# Patient Record
Sex: Female | Born: 2000
Health system: Southern US, Community
[De-identification: ages and names within clinical notes are randomized; demographics above are authoritative.]

## PROBLEM LIST (undated history)

## (undated) DIAGNOSIS — T7840XA Allergy, unspecified, initial encounter: Secondary | ICD-10-CM

## (undated) HISTORY — DX: Allergy, unspecified, initial encounter: T78.40XA

---
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
## 2018-02-04 ENCOUNTER — Encounter: Payer: Self-pay | Admitting: Obstetrics & Gynecology

## 2018-02-04 ENCOUNTER — Ambulatory Visit (INDEPENDENT_AMBULATORY_CARE_PROVIDER_SITE_OTHER): Payer: 59 | Admitting: Obstetrics & Gynecology

## 2018-02-04 VITALS — BP 125/81 | HR 69 | Wt 175.7 lb

## 2018-02-04 DIAGNOSIS — N83291 Other ovarian cyst, right side: Secondary | ICD-10-CM | POA: Diagnosis not present

## 2018-02-04 NOTE — Progress Notes (Signed)
History:  17 y.o. G0  Pt was in the ED in July had pain . Was noted to have a cyst on her voary.  Has not had any pain since that day. Sexually active. No new partners.  Pt has a 5 year IUD. Placed Dec. 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    The following portions of the patient's history were reviewed and updated as appropriate: allergies, current medications, past family history, past medical history, past social history, past surgical history and problem list.  Review of Systems:  Pertinent items are noted in HPI.    Objective:  Physical Exam Blood pressure 125/81, pulse 69, weight 175 lb 11.2 oz (79.7 kg), last menstrual period 02/02/2018.  CONSTITUTIONAL: Well-developed, well-nourished female in no acute distress.  HENT:  Normocephalic, atraumatic EYES: Conjunctivae and EOM are normal. No scleral icterus.  NECK: Normal range of motion SKIN: Skin is warm and dry. No rash noted. Not diaphoretic.No pallor. NEUROLGIC: Alert and oriented to person, place, and time. Normal coordination.  Lungs: CTA CV: RRR Abd: Soft, nontender and nondistended Pelvic: Normal appearing external genitalia; Normal discharge.  Small uterus, no other palpable masses, no uterine or adnexal tenderness; IUD strings palpable. NO CMT  Labs and Imaging 7/8/20149 CLINICAL DATA:  Lower abdominal pain began approximately 45 minutes prior to presentation. Evaluate for possible tubo-ovarian abscess or torsion. The patient has an IUD. Onset of last normal menstrual period was December 03, 2017.  EXAM: TRANSABDOMINAL AND TRANSVAGINAL ULTRASOUND OF PELVIS  DOPPLER ULTRASOUND OF OVARIES  TECHNIQUE: Both transabdominal and transvaginal ultrasound examinations of the pelvis were performed. Transabdominal technique was performed for global imaging of the pelvis including uterus, ovaries, adnexal regions, and pelvic cul-de-sac.  It was necessary to proceed with endovaginal exam following  the transabdominal exam to visualize the uterus and adnexal structures. Color and duplex Doppler ultrasound was utilized to evaluate blood flow to the ovaries.  COMPARISON:  None in PACs  FINDINGS: Uterus  Measurements: 6.3 x 3.4 x 3.4 cm. No fibroids or other mass visualized.  Endometrium  Thickness: 9 mm. The echogenic IUD is visible. No abnormal endometrial fluid collections are observed.  Right ovary  Measurements: 5.7 x 4.3 x 3.9 cm. There is a complex appearing cystic structure measuring 3.3 x 2.8 x 2.3 cm  Left ovary  Measurements: 3.7 x 1.8 x 1.4 cm. Normal appearance/no adnexal mass.  Pulsed Doppler evaluation of both ovaries demonstrates normal low-resistance arterial and venous waveforms.  Other findings  There is free fluid in the pelvis containing a small amount of debris.  IMPRESSION: No evidence of ovarian torsion. Complex cystic right adnexal mass either within or arising from the ovary which is not hypervascular may reflect a hemorrhagic cyst or tubo-ovarian abscess. Ectopic pregnancy or neoplasm is felt less likely.  Normal appearing left ovary. Small amount of free fluid containing debris within the cul-de-sac.  Normal appearance of the uterus with IUD.  Gynecological evaluation is recommended.   Assessment & Plan:  Right ov cyst- small; asymptomatic. Marland Kitchen.   Suspect resolution   Repeat US in 3 months  Discussed school and choice of major.   F/u in 1 year for annual   Destiny Trickey L. Harraway-Smith, M.D., Evern CoreFACOG

## 2018-02-05 ENCOUNTER — Encounter: Payer: Self-pay | Admitting: Obstetrics & Gynecology

## 2018-02-25 DIAGNOSIS — H5213 Myopia, bilateral: Secondary | ICD-10-CM | POA: Diagnosis not present

## 2018-02-25 DIAGNOSIS — H52223 Regular astigmatism, bilateral: Secondary | ICD-10-CM | POA: Diagnosis not present

## 2020-02-10 IMAGING — US US ART/VEN ABD/PELV/SCROTUM DOPPLER LTD
1 series · 13 of 25 positions shown · non-contrast
Comparison: None in PACs

CLINICAL DATA: Lower abdominal pain began approximately 45 minutes
prior to presentation. Evaluate for possible tubo-ovarian abscess or
torsion. The patient has an IUD. Onset of last normal menstrual
period was December 03, 2017.

EXAM:
TRANSABDOMINAL AND TRANSVAGINAL ULTRASOUND OF PELVIS
DOPPLER ULTRASOUND OF OVARIES
TECHNIQUE: Both transabdominal and transvaginal ultrasound examinations of the
pelvis were performed. Transabdominal technique was performed for
global imaging of the pelvis including uterus, ovaries, adnexal
regions, and pelvic cul-de-sac.
It was necessary to proceed with endovaginal exam following the
transabdominal exam to visualize the uterus and adnexal structures..
Color and duplex Doppler ultrasound was utilized to evaluate blood
flow to the ovaries.

[Series 1: us art/ven abd/pelv/scrotum doppler ltd · 0.24mm/px · 107 acquisitions, 13 frames shown]
[im 1/107]
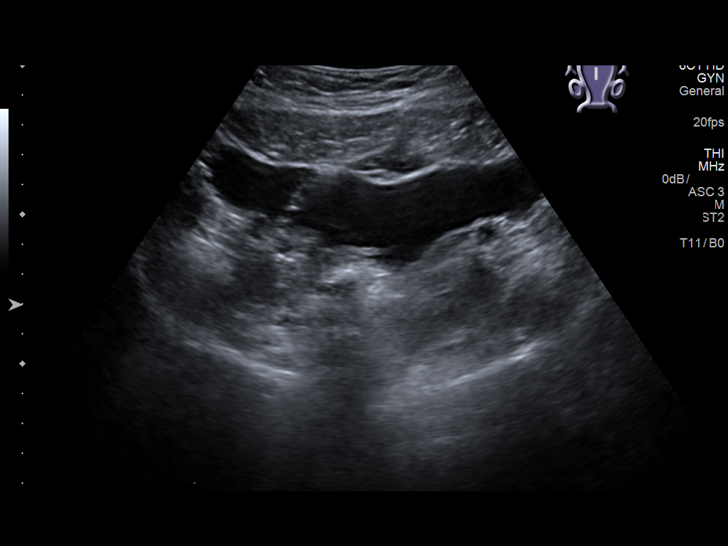
[im 9/107]
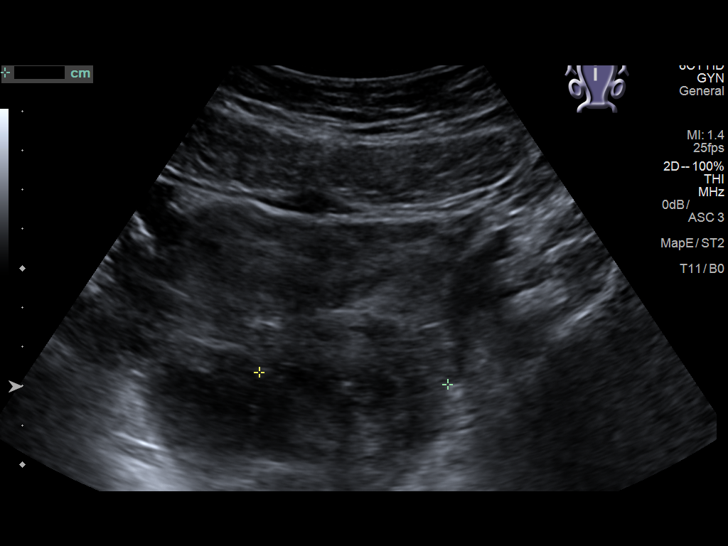
[im 18/107]
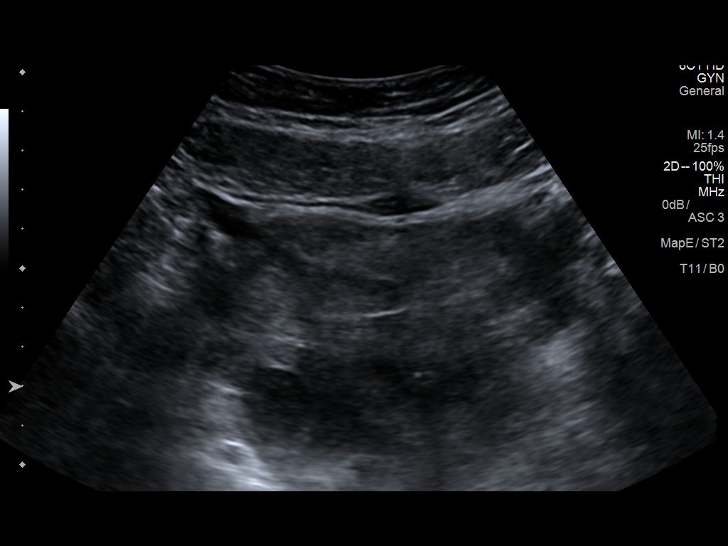
[im 27/107]
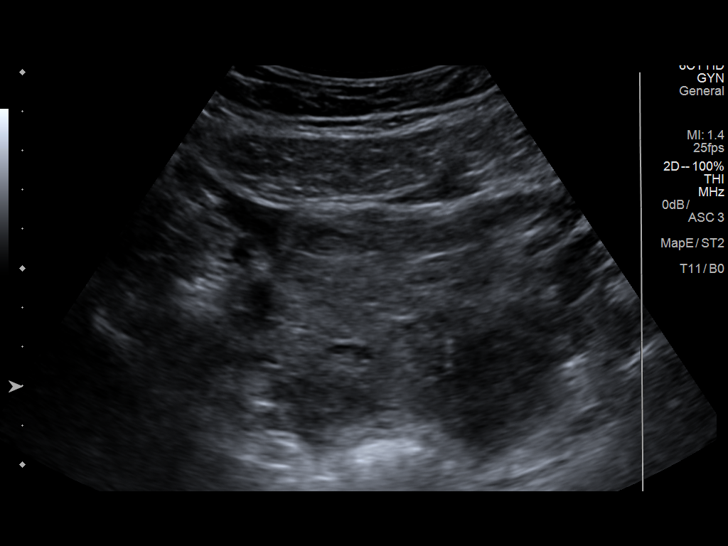
[im 36/107]
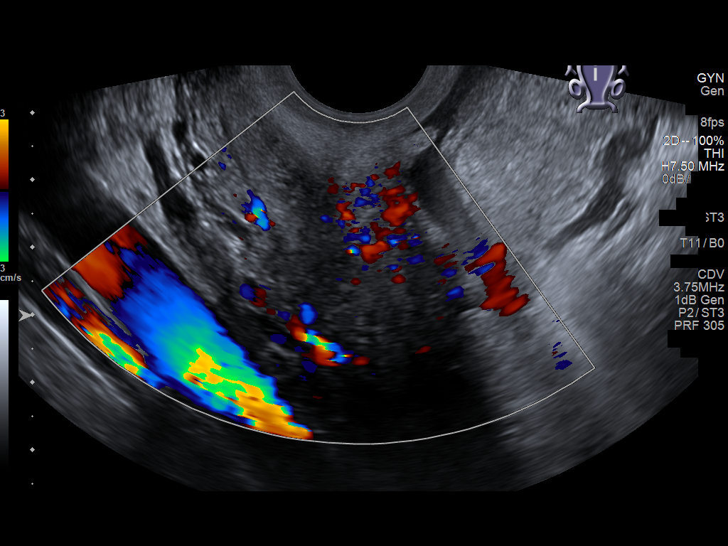
[im 45/107]
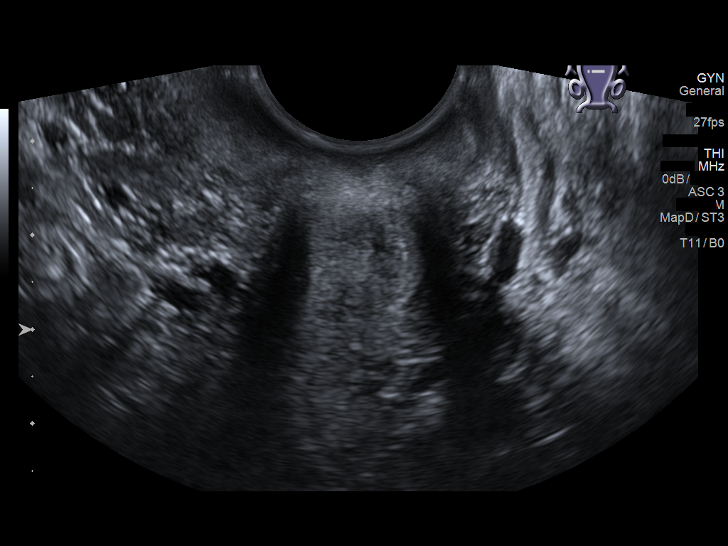
[im 54/107]
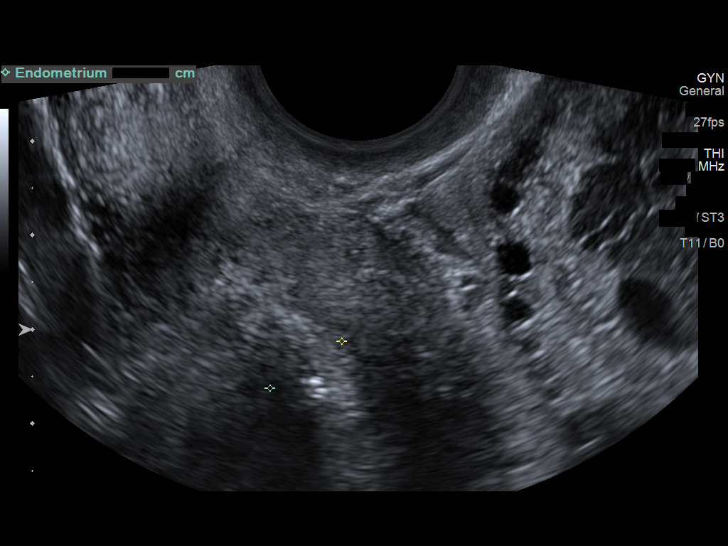
[im 62/107]
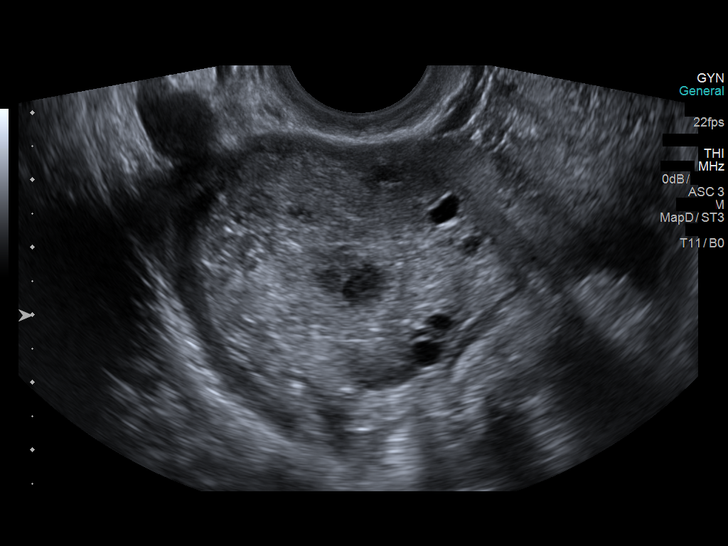
[im 71/107]
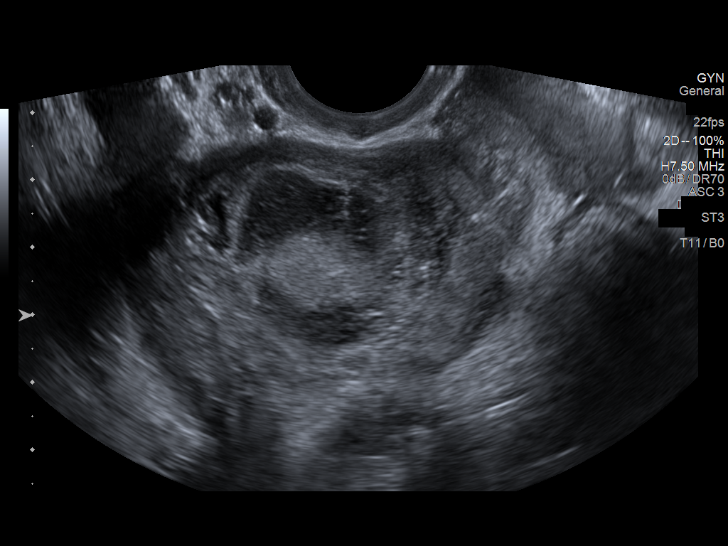
[im 80/107]
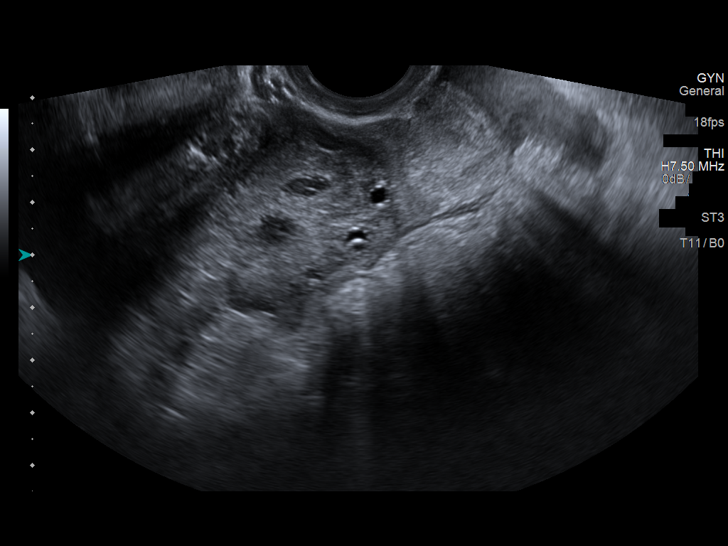
[im 89/107]
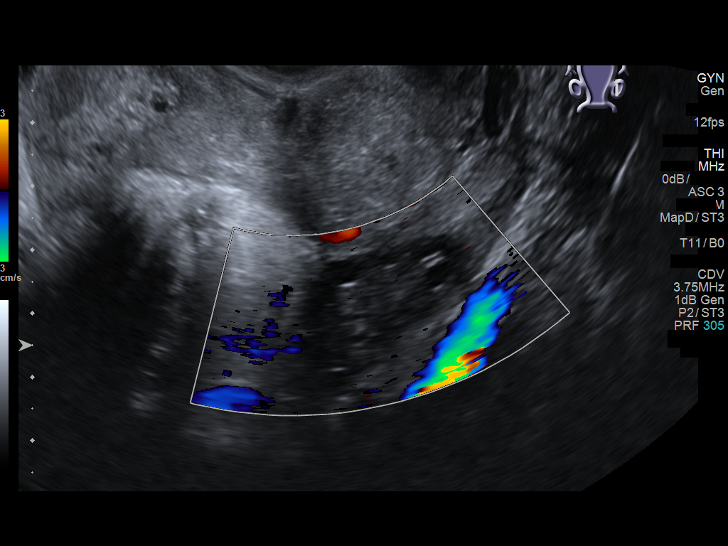
[im 98/107]
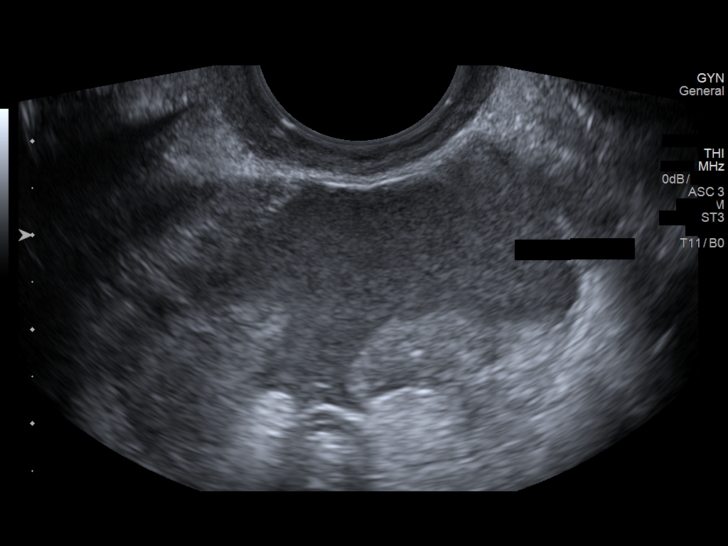
[im 107/107]
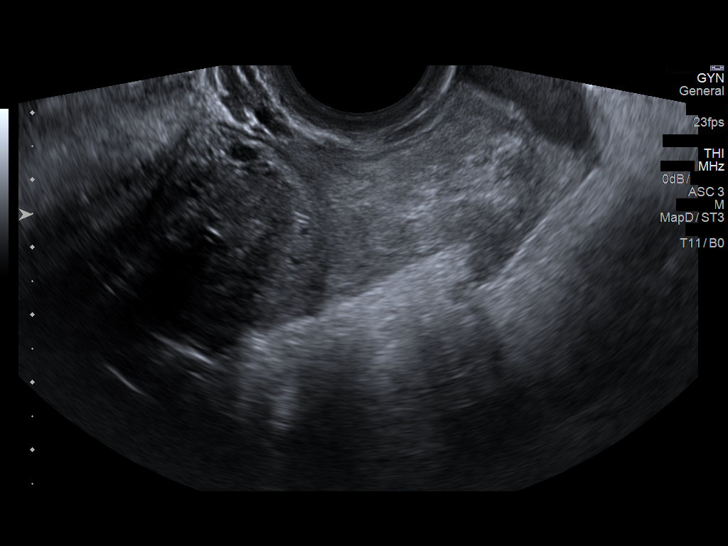

[13 of 25 positions shown; findings below may reference images not displayed]

FINDINGS: Uterus

Measurements: 6.3 x 3.4 x 3.4 cm. No fibroids or other mass
visualized.

Endometrium

Thickness: 9 mm. The echogenic IUD is visible. No abnormal
endometrial fluid collections are observed.

Right ovary

Measurements: 5.7 x 4.3 x 3.9 cm. There is a complex appearing
cystic structure measuring 3.3 x 2.8 x 2.3 cm

Left ovary

Measurements: 3.7 x 1.8 x 1.4 cm. Normal appearance/no adnexal mass.

Pulsed Doppler evaluation of both ovaries demonstrates normal
low-resistance arterial and venous waveforms.

Other findings

There is free fluid in the pelvis containing a small amount of
debris.
IMPRESSION: No evidence of ovarian torsion. Complex cystic right adnexal mass
either within or arising from the ovary which is not hypervascular
may reflect a hemorrhagic cyst or tubo-ovarian abscess. Ectopic
pregnancy or neoplasm is felt less likely.

Normal appearing left ovary. Small amount of free fluid containing
debris within the cul-de-sac.

Normal appearance of the uterus with IUD.

Gynecological evaluation is recommended.

## 2020-05-20 ENCOUNTER — Emergency Department (HOSPITAL_COMMUNITY)
Admission: EM | Admit: 2020-05-20 | Discharge: 2020-05-20 | Disposition: A | Discharge status: Home / Self Care | Payer: 59 | Attending: Emergency Medicine | Admitting: Emergency Medicine

## 2020-05-20 ENCOUNTER — Encounter (HOSPITAL_COMMUNITY): Payer: Self-pay | Admitting: Emergency Medicine

## 2020-05-20 ENCOUNTER — Other Ambulatory Visit: Payer: Self-pay

## 2020-05-20 DIAGNOSIS — J029 Acute pharyngitis, unspecified: Secondary | ICD-10-CM | POA: Insufficient documentation

## 2020-05-20 DIAGNOSIS — Z20822 Contact with and (suspected) exposure to covid-19: Secondary | ICD-10-CM | POA: Insufficient documentation

## 2020-05-20 LAB — RESP PANEL BY RT-PCR (FLU A&B, COVID) ARPGX2
Influenza A by PCR: NEGATIVE
Influenza B by PCR: NEGATIVE
SARS Coronavirus 2 by RT PCR: NEGATIVE

## 2020-05-20 LAB — GROUP A STREP BY PCR: Group A Strep by PCR: NOT DETECTED

## 2020-05-20 MED ORDER — CLINDAMYCIN PALMITATE HCL 75 MG/5ML PO SOLR: 300.0000 mg | Freq: Three times a day (TID) | ORAL | 0 refills | Status: AC

## 2020-05-20 MED ORDER — DEXAMETHASONE 1 MG/ML PO CONC
10.0000 mg | Freq: Once | ORAL | Status: AC
  Administered 2020-05-20: 10 mg via ORAL
  Filled 2020-05-20: qty 10

## 2020-05-20 NOTE — ED Triage Notes (Addendum)
Patient states about 5 days ago began having sore throat with tonsil swelling. Patient states that her tonsils are swollen to the point that they are touching, patient states she is having a hard time swallowing, but is able to maintain her secretions. Patient speaking in full sentences, breathing unlabored.

## 2020-05-20 NOTE — ED Provider Notes (Signed)
[facility]-EMERGENCY DEPT Provider Note   CSN: [id number] Arrival date & time: 05/20/20  0630     History Chief Complaint  Patient presents with   Sore Throat    Phyllis ALDONIA KEEVEN is a 19 y.o. female.  HPI   19 year old female history of allergies who presents emergency department today for evaluation of sore throat.  2 weeks ago had rhinorrhea and congestion but that is since resolved.  For the last several days she has had swollen tonsils with tonsil stones she says.  Denies any cough or fevers at home.  Has been able to eat and drink but does have pain with this.  Past Medical History:  Diagnosis Date   Allergy     There are no problems to display for this patient.   History reviewed. No pertinent surgical history.   OB History   No obstetric history on file.     Family History  Problem Relation Age of Onset   Hypertension Mother    Diabetes Maternal Grandmother    Cancer Maternal Grandmother    Cancer Maternal Grandfather    Diabetes Paternal Grandmother     Social History   Tobacco Use   Smoking status: Never Smoker   Smokeless tobacco: Never Used  Substance Use Topics   Alcohol use: Not Currently   Drug use: Not Currently    Home Medications Prior to Admission medications   Medication Sig Start Date End Date Taking? Authorizing Provider  clindamycin (CLEOCIN) 75 MG/5ML solution Take 20 mLs (300 mg total) by mouth 3 (three) times daily for 7 days. 05/20/20 05/27/20  Makeya Hilgert S, PA-C  levonorgestrel (MIRENA) 20 MCG/24HR IUD 1 each by Intrauterine route once.    [provider]    Allergies    Apple  Review of Systems   Review of Systems  Constitutional: Negative for fever.  HENT: Positive for sore throat.   Eyes: Negative for visual disturbance.  Respiratory: Negative for cough and shortness of breath.   Cardiovascular: Negative for chest pain.  Gastrointestinal: Negative for abdominal pain.    Genitourinary: Negative for pelvic pain.  Musculoskeletal: Negative for back pain.    Physical Exam Updated Vital Signs BP (!) 128/93    Pulse 68    Temp 98 F (36.7 C) (Oral)    Resp 16    Ht 5\' 8"  (1.727 m)    Wt 74.8 kg    SpO2 96%    BMI 25.09 kg/m   Physical Exam Vitals and nursing note reviewed.  Constitutional:      General: She is not in acute distress.    Appearance: She is well-developed.  HENT:     Head: Normocephalic and atraumatic.     Mouth/Throat:     Mouth: Mucous membranes are moist.     Pharynx: Uvula midline. Oropharyngeal exudate and posterior oropharyngeal erythema present.     Tonsils: Tonsillar exudate present. 2+ on the right. 2+ on the left.  Eyes:     Conjunctiva/sclera: Conjunctivae normal.  Cardiovascular:     Rate and Rhythm: Regular rhythm. Tachycardia present.  Pulmonary:     Effort: Pulmonary effort is normal.     Breath sounds: Normal breath sounds.  Musculoskeletal:        General: Normal range of motion.     Cervical back: Neck supple.  Skin:    General: Skin is warm and dry.  Neurological:     Mental Status: She is alert.  ED Results / Procedures / Treatments   Labs (all labs ordered are listed, but only abnormal results are displayed) Labs Reviewed  GROUP A STREP BY PCR  RESP PANEL BY RT-PCR (FLU A&B, COVID) [id number]    EKG None  Radiology No results found.  Procedures Procedures (including critical care time)  Medications Ordered in ED Medications  dexamethasone (DECADRON) 1 MG/ML solution 10 mg (10 mg Oral Given 05/20/20 0756)    ED Course  I have reviewed the triage vital signs and the nursing notes.  Pertinent labs & imaging results that were available during my care of the patient were reviewed by me and considered in my medical decision making (see chart for details).    MDM Rules/Calculators/A&P                          Pt febrile with tonsillar exudate, cervical lymphadenopathy, & dysphagia; strep  test negative however clinically concerned for bacterial pharyngitis. Treated in the Ed with steroids. Will give rx for clindamycin for home.  Pt appears mildly dehydrated, discussed importance of water rehydration. Presentation non concerning for PTA or infxn spread to soft tissue. No trismus or uvula deviation. Specific return precautions discussed. Pt able to drink water at home without difficulty with intact air way. Recommended PCP follow up. Also gave ENT f/u as family at bedside states pt gets frequent tonsillitis. Advised on return precautions. She voices understanding and is in agreement with the plan.   Final Clinical Impression(s) / ED Diagnoses Final diagnoses:  Exudative pharyngitis    Rx / DC Orders ED Discharge Orders         Ordered    clindamycin (CLEOCIN) 75 MG/5ML solution  3 times daily        05/20/20 0908           Karrie Meres, PA-C 05/20/20 1426    Derwood Kaplan, MD 05/21/20 0825

## 2020-05-20 NOTE — Discharge Instructions (Addendum)
You were given a prescription for antibiotics. Please take the antibiotic prescription fully.   Please follow up with your primary care provider and with the ENT provider within 5-7 days for re-evaluation of your symptoms. If you do not have a primary care provider, information for a healthcare clinic has been provided for you to make arrangements for follow up care. Please return to the emergency department for any new or worsening symptoms.
